# Patient Record
Sex: Male | Born: 1970 | Race: Black or African American | Hispanic: No | Marital: Married | Smoking: Current every day smoker
Health system: Southern US, Community
[De-identification: ages and names within clinical notes are randomized; demographics above are authoritative.]

## PROBLEM LIST (undated history)

## (undated) DIAGNOSIS — I10 Essential (primary) hypertension: Secondary | ICD-10-CM

## (undated) DIAGNOSIS — J45909 Unspecified asthma, uncomplicated: Secondary | ICD-10-CM

---

## 2000-08-20 ENCOUNTER — Emergency Department (HOSPITAL_COMMUNITY): Admission: EM | Admit: 2000-08-20 | Discharge: 2000-08-20 | Payer: Self-pay | Admitting: Emergency Medicine

## 2000-08-20 ENCOUNTER — Encounter: Payer: Self-pay | Admitting: Emergency Medicine

## 2018-06-02 ENCOUNTER — Emergency Department (HOSPITAL_COMMUNITY): Payer: Medicaid Other

## 2018-06-02 ENCOUNTER — Other Ambulatory Visit: Payer: Self-pay

## 2018-06-02 ENCOUNTER — Emergency Department (HOSPITAL_COMMUNITY)
Admission: EM | Admit: 2018-06-02 | Discharge: 2018-06-02 | Disposition: A | Discharge status: Home / Self Care | Payer: Medicaid Other | Attending: Emergency Medicine | Admitting: Emergency Medicine

## 2018-06-02 ENCOUNTER — Encounter (HOSPITAL_COMMUNITY): Payer: Self-pay | Admitting: Emergency Medicine

## 2018-06-02 DIAGNOSIS — J45909 Unspecified asthma, uncomplicated: Secondary | ICD-10-CM | POA: Insufficient documentation

## 2018-06-02 DIAGNOSIS — I10 Essential (primary) hypertension: Secondary | ICD-10-CM | POA: Insufficient documentation

## 2018-06-02 DIAGNOSIS — L089 Local infection of the skin and subcutaneous tissue, unspecified: Secondary | ICD-10-CM | POA: Diagnosis not present

## 2018-06-02 DIAGNOSIS — F17210 Nicotine dependence, cigarettes, uncomplicated: Secondary | ICD-10-CM | POA: Insufficient documentation

## 2018-06-02 DIAGNOSIS — M79674 Pain in right toe(s): Secondary | ICD-10-CM | POA: Diagnosis present

## 2018-06-02 HISTORY — DX: Unspecified asthma, uncomplicated: J45.909

## 2018-06-02 HISTORY — DX: Essential (primary) hypertension: I10

## 2018-06-02 LAB — CBC WITH DIFFERENTIAL/PLATELET
Abs Immature Granulocytes: 0.03 10*3/uL (ref 0.00–0.07)
Basophils Absolute: 0.1 10*3/uL (ref 0.0–0.1)
Basophils Relative: 1 %
Eosinophils Absolute: 0.4 10*3/uL (ref 0.0–0.5)
Eosinophils Relative: 6 %
HCT: 37.2 % — ABNORMAL LOW (ref 39.0–52.0)
Hemoglobin: 11.8 g/dL — ABNORMAL LOW (ref 13.0–17.0)
Immature Granulocytes: 0 %
Lymphocytes Relative: 40 %
Lymphs Abs: 2.7 10*3/uL (ref 0.7–4.0)
MCH: 31.1 pg (ref 26.0–34.0)
MCHC: 31.7 g/dL (ref 30.0–36.0)
MCV: 97.9 fL (ref 80.0–100.0)
Monocytes Absolute: 0.6 10*3/uL (ref 0.1–1.0)
Monocytes Relative: 9 %
Neutro Abs: 3 10*3/uL (ref 1.7–7.7)
Neutrophils Relative %: 44 %
Platelets: 293 10*3/uL (ref 150–400)
RBC: 3.8 MIL/uL — ABNORMAL LOW (ref 4.22–5.81)
RDW: 12.3 % (ref 11.5–15.5)
WBC: 6.7 10*3/uL (ref 4.0–10.5)
nRBC: 0 % (ref 0.0–0.2)

## 2018-06-02 LAB — BASIC METABOLIC PANEL
Anion gap: 9 (ref 5–15)
BUN: 13 mg/dL (ref 6–20)
CO2: 26 mmol/L (ref 22–32)
Calcium: 9 mg/dL (ref 8.9–10.3)
Chloride: 105 mmol/L (ref 98–111)
Creatinine, Ser: 0.84 mg/dL (ref 0.61–1.24)
GFR calc Af Amer: >60 mL/min (ref >60)
GFR calc non Af Amer: >60 mL/min (ref >60)
Glucose, Bld: 122 mg/dL — ABNORMAL HIGH (ref 70–99)
Potassium: 3.9 mmol/L (ref 3.5–5.1)
Sodium: 140 mmol/L (ref 135–145)

## 2018-06-02 MED ORDER — HYDROCODONE-ACETAMINOPHEN 5-325 MG PO TABS
2.0000 | ORAL_TABLET | Freq: Once | ORAL | Status: AC
  Administered 2018-06-02: 2 via ORAL
  Filled 2018-06-02: qty 2

## 2018-06-02 MED ORDER — DOXYCYCLINE HYCLATE 100 MG PO CAPS: 100.0000 mg | ORAL_CAPSULE | Freq: Two times a day (BID) | ORAL | 0 refills | Status: AC

## 2018-06-02 NOTE — ED Triage Notes (Signed)
Patient is complaining of right foot pain. Patient has open skin in between fourth and fifth toe. Patient thinks it is infected. Patient states he had it about a week.

## 2018-06-02 NOTE — ED Provider Notes (Signed)
[facility]-EMERGENCY DEPT Provider Note   CSN: [id number] Arrival date & time: 06/02/18  0411     History   Chief Complaint Chief Complaint  Patient presents with  . Foot Pain    HPI Wayne Lewis is a 47 y.o. male.  Patient presents to the emergency department with chief complaint of right small toe pain.  He states that he noticed an infection and wound between his right fourth and fifth toes about a week ago.  It has festered all week long.  He denies any fever.  States that he does have severe pain.  Denies history of diabetes.  He is tried soaking it in Epsom salts and peroxide with no relief.  He denies any known injury.  The history is provided by the patient. No language interpreter was used.    Past Medical History:  Diagnosis Date  . Asthma   . Hypertension     There are no active problems to display for this patient.   History reviewed. No pertinent surgical history.      Home Medications    Prior to Admission medications   Not on File    Family History History reviewed. No pertinent family history.  Social History Social History   Tobacco Use  . Smoking status: Current Every Day Smoker    Types: Cigarettes  . Smokeless tobacco: Never Used  Substance Use Topics  . Alcohol use: Yes  . Drug use: Yes    Types: Marijuana     Allergies   Lisinopril   Review of Systems Review of Systems  All other systems reviewed and are negative.    Physical Exam Updated Vital Signs BP (!) 174/131 (BP Location: Left Arm)   Pulse 84   Temp 97.8 F (36.6 C) (Oral)   Resp 16   Ht 5\' 8"  (1.727 m)   Wt 104.3 kg   SpO2 96%   BMI 34.97 kg/m   Physical Exam  Constitutional: He is oriented to person, place, and time. No distress.  HENT:  Head: Normocephalic and atraumatic.  Eyes: Pupils are equal, round, and reactive to light. Conjunctivae and EOM are normal.  Neck: No tracheal deviation present.  Cardiovascular: Normal  rate.  Pulmonary/Chest: Effort normal. No respiratory distress.  Abdominal: Soft.  Musculoskeletal: Normal range of motion.  Neurological: He is alert and oriented to person, place, and time.  Skin: Skin is warm and dry. He is not diaphoretic.  Wound between 4th and 5th right toes, erythema of the entire right little toe  Psychiatric: Judgment normal.  Nursing note and vitals reviewed.    ED Treatments / Results  Labs (all labs ordered are listed, but only abnormal results are displayed) Labs Reviewed - No data to display  EKG None  Radiology No results found.  Procedures Procedures (including critical care time)  Medications Ordered in ED Medications  HYDROcodone-acetaminophen (NORCO/VICODIN) 5-325 MG per tablet 2 tablet (has no administration in time range)     Initial Impression / Assessment and Plan / ED Course  I have reviewed the triage vital signs and the nursing notes.  Pertinent labs & imaging results that were available during my care of the patient were reviewed by me and considered in my medical decision making (see chart for details).     Patient with wound and erythema surround right little toe.  Concern for osteo.  Will check plain films and labs.  Labs are reassuring.  No bony involvement on x-ray.  Wound  care instructions given.  Not diabetic.  Will treat with doxy.  PCP follow-up.  Final Clinical Impressions(s) / ED Diagnoses   Final diagnoses:  Toe infection    ED Discharge Orders         Ordered    doxycycline (VIBRAMYCIN) 100 MG capsule  2 times daily     06/02/18 0641           Roxy Horseman, PA-C 06/02/18 1610    Palumbo, April, MD 06/02/18 9604

## 2019-03-30 IMAGING — DX DG FOOT COMPLETE 3+V*R*
3 series · 3 of 3 positions shown · non-contrast
Comparison: None.

CLINICAL DATA: Right foot pain. Open skin between the fourth and
fifth toe, possibly infected.

EXAM:
RIGHT FOOT COMPLETE - 3+ VIEW

[foot ap]
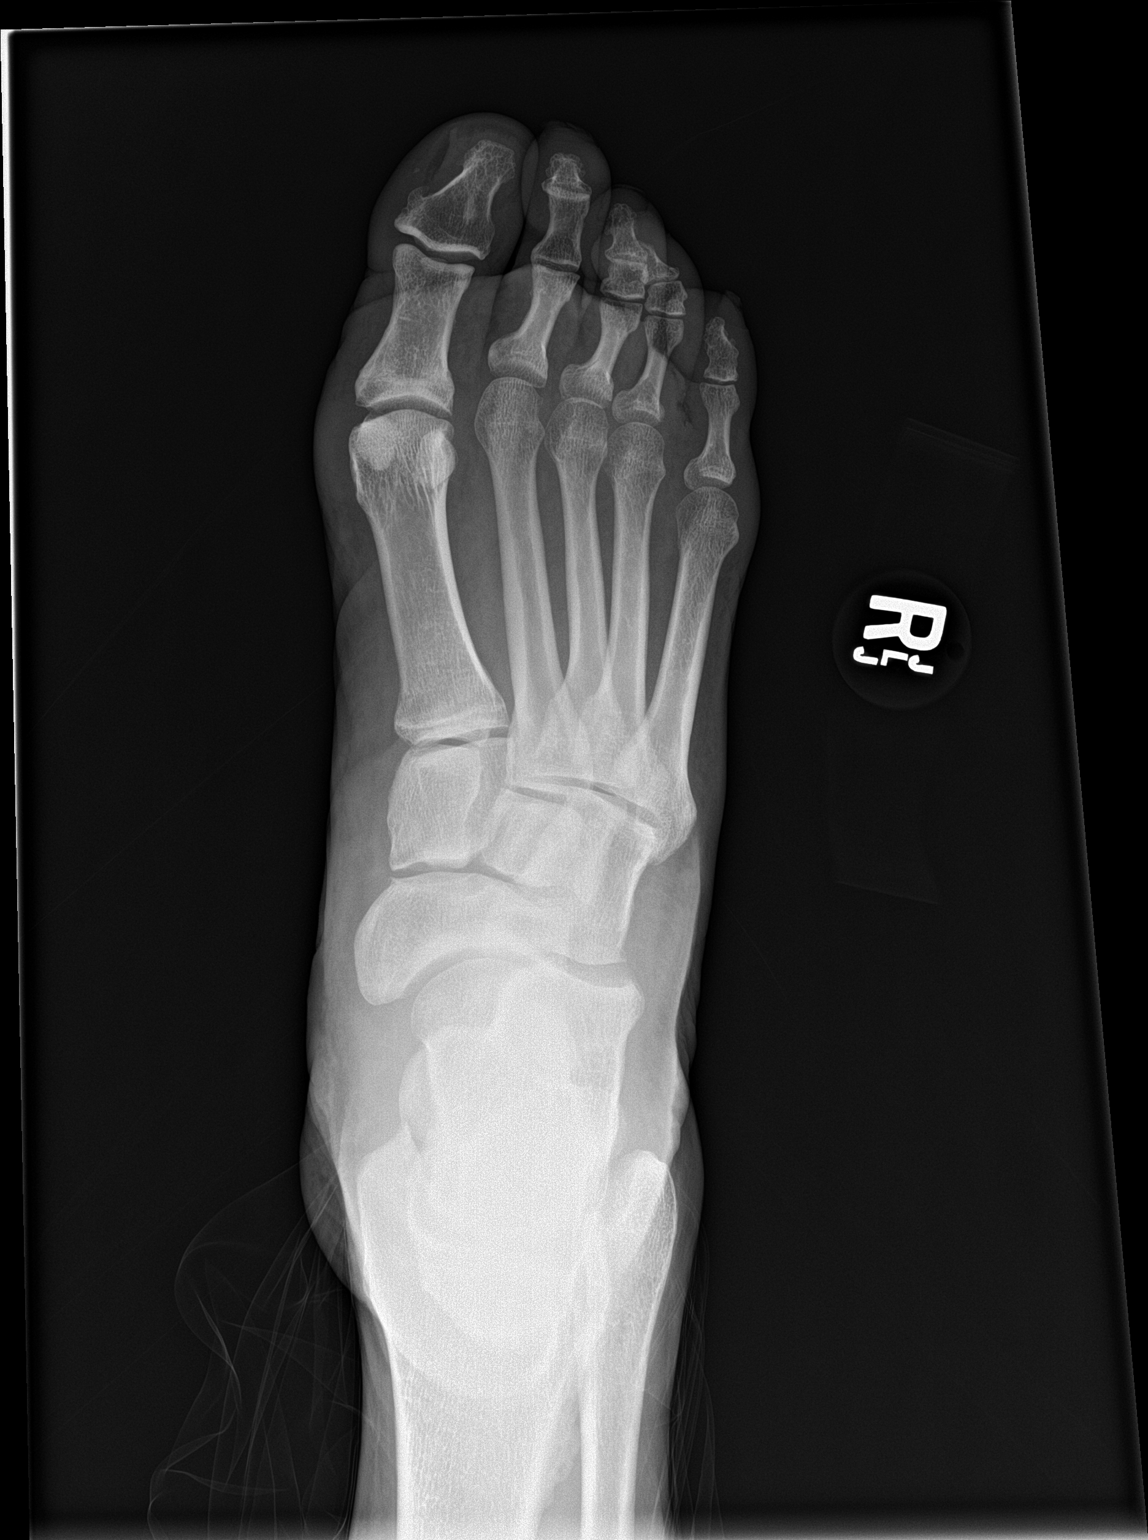

[foot obl]
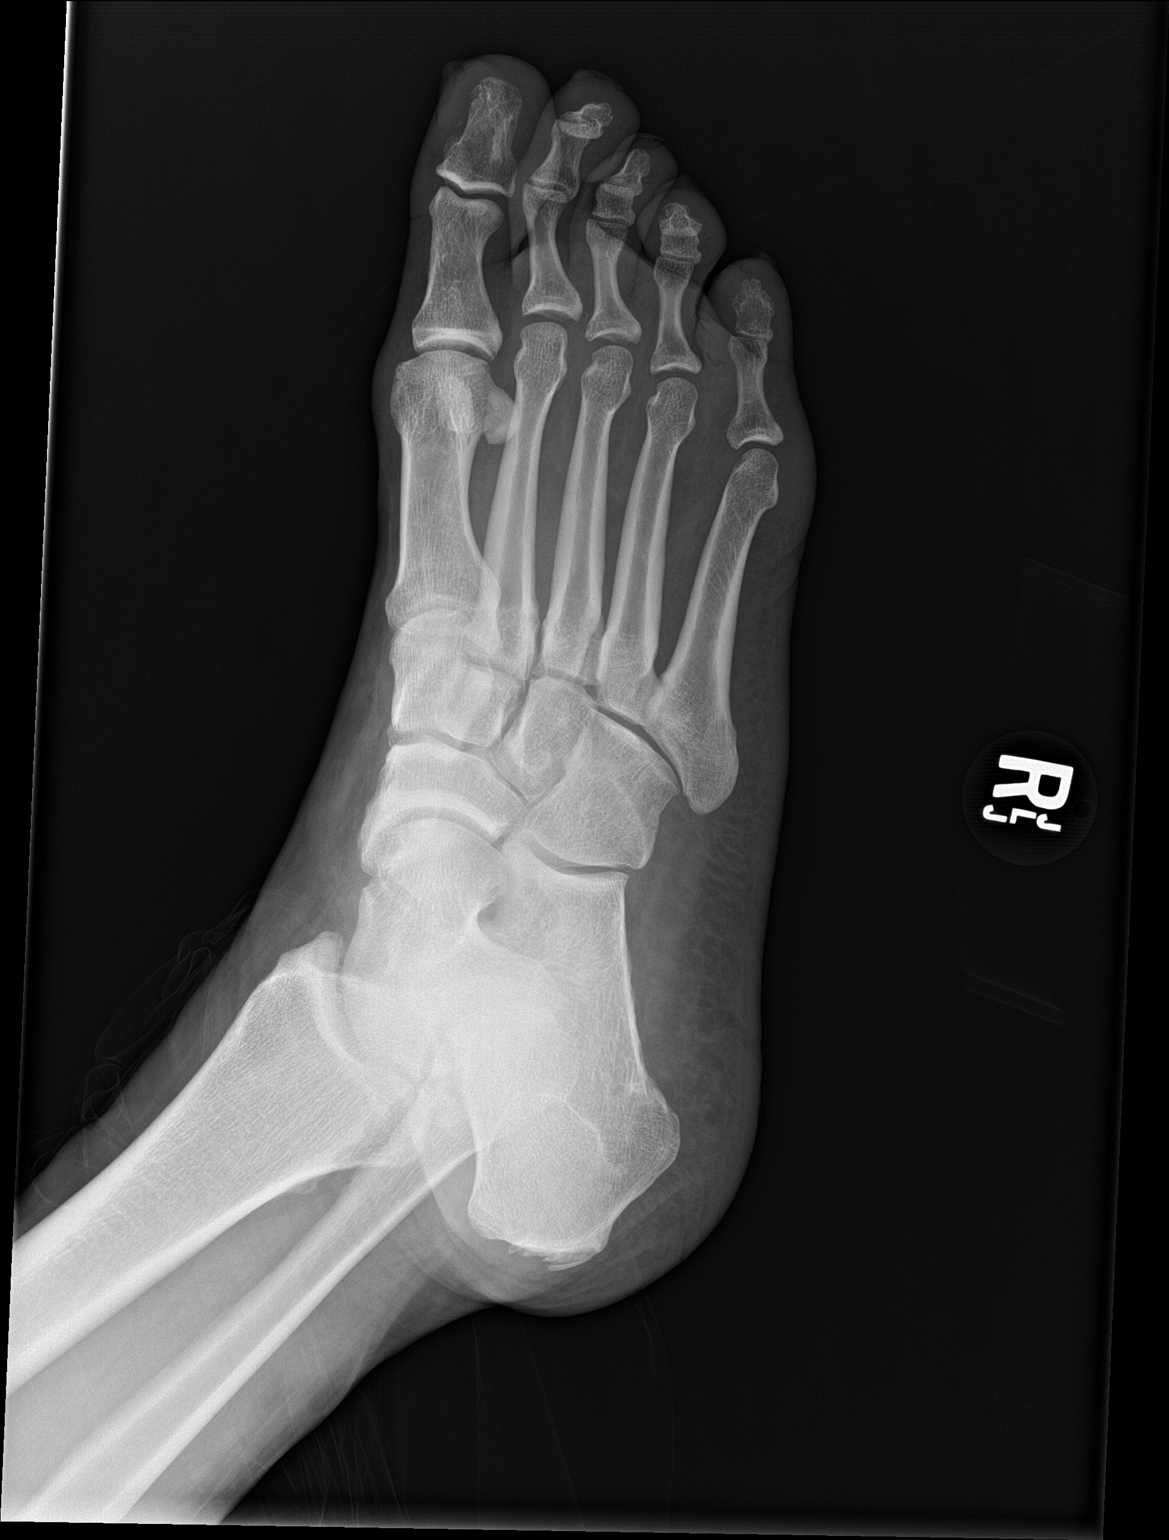

[foot lat]
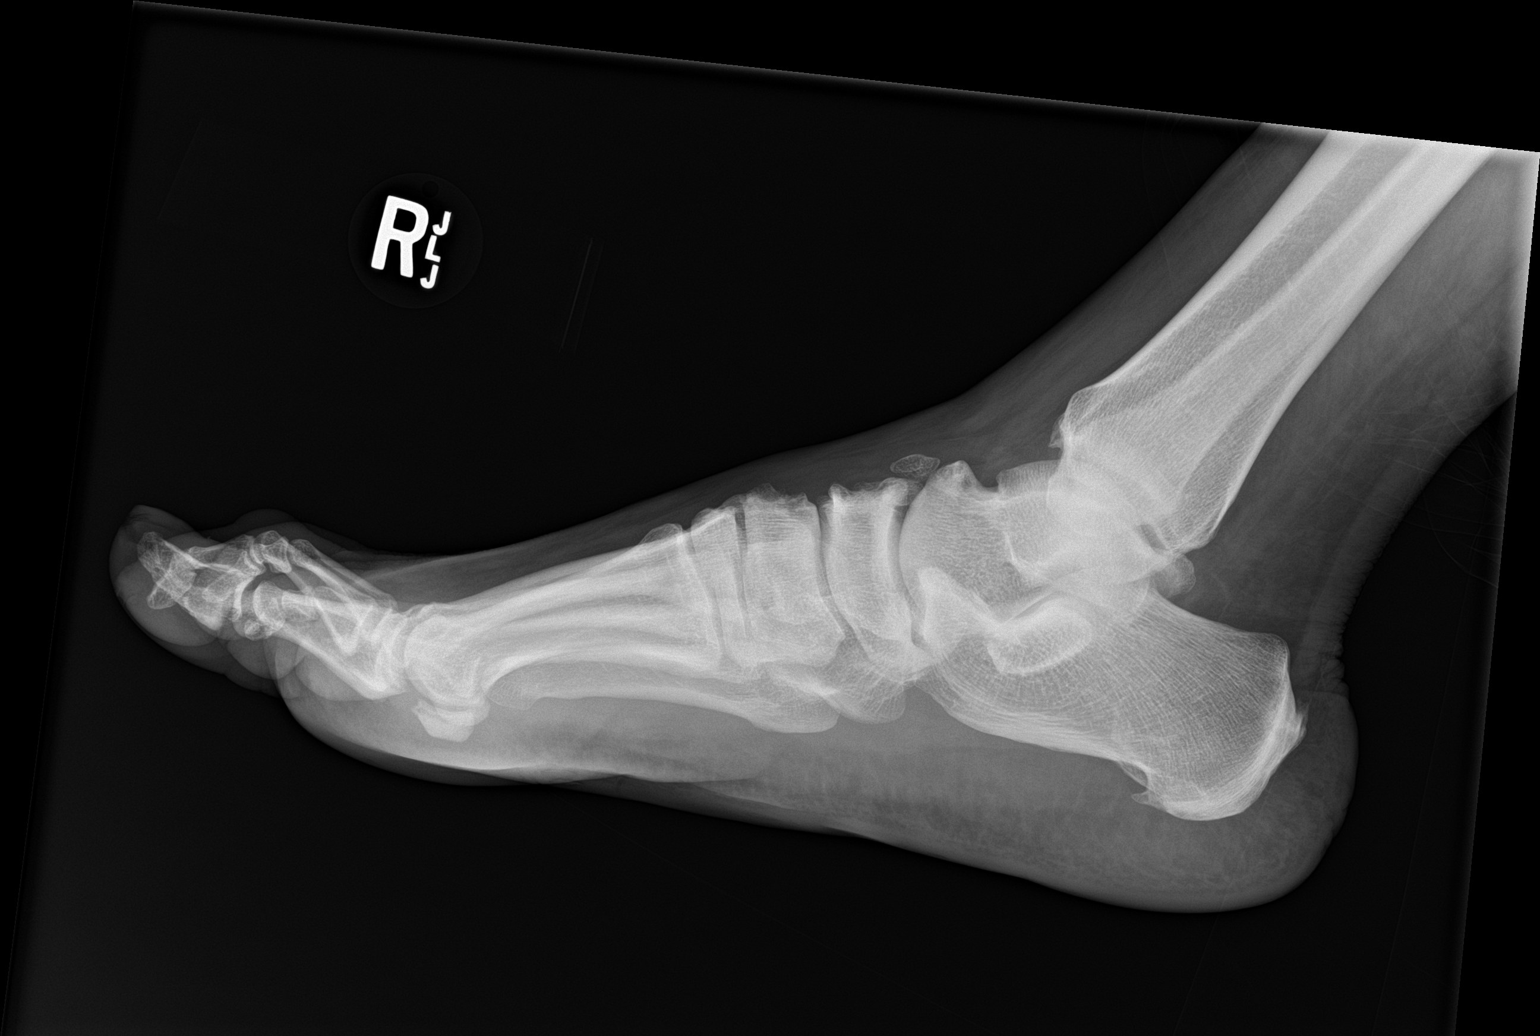

[3 of 3 positions shown; findings below may reference images not displayed]

FINDINGS: Mild soft tissue swelling and subcutaneous emphysema in the webspace
between the fourth and fifth toes corresponding to the clinical skin
lesion. Bones appear intact. No bone erosion to suggest
osteomyelitis. Degenerative changes in the intertarsal joints and
first metatarsal-phalangeal joint. Small calcaneal spurs.
IMPRESSION: Soft tissue swelling and subcutaneous emphysema in the webspace
between the fourth and fifth toes. No bony changes to suggest
osteomyelitis.
